# Patient Record
Sex: Female | Born: 1965 | Race: White | Hispanic: No | Marital: Married
Health system: Southern US, Community
[De-identification: ages and names within clinical notes are randomized; demographics above are authoritative.]

---

## 2004-06-11 ENCOUNTER — Ambulatory Visit: Payer: Self-pay | Admitting: Unknown Physician Specialty

## 2005-07-06 ENCOUNTER — Ambulatory Visit: Payer: Self-pay | Admitting: Unknown Physician Specialty

## 2006-06-27 ENCOUNTER — Ambulatory Visit: Payer: Self-pay | Admitting: Unknown Physician Specialty

## 2006-06-27 ENCOUNTER — Other Ambulatory Visit: Payer: Self-pay

## 2006-07-07 ENCOUNTER — Ambulatory Visit: Payer: Self-pay | Admitting: Unknown Physician Specialty

## 2006-10-06 ENCOUNTER — Ambulatory Visit: Payer: Self-pay | Admitting: Unknown Physician Specialty

## 2007-04-14 ENCOUNTER — Ambulatory Visit: Payer: Self-pay | Admitting: Gastroenterology

## 2007-04-18 ENCOUNTER — Ambulatory Visit: Payer: Self-pay | Admitting: Gastroenterology

## 2007-10-13 ENCOUNTER — Ambulatory Visit: Payer: Self-pay | Admitting: Unknown Physician Specialty

## 2008-11-12 IMAGING — CR DG BARIUM ENEMA
1 series · 8 of 9 positions shown · non-contrast
Comparison: none

REASON FOR EXAM: Sigmoid stricture
COMMENTS:

[Series 1: view not recorded · 0.17mm/px · 8 of 9 slices shown]
[im 1/9]
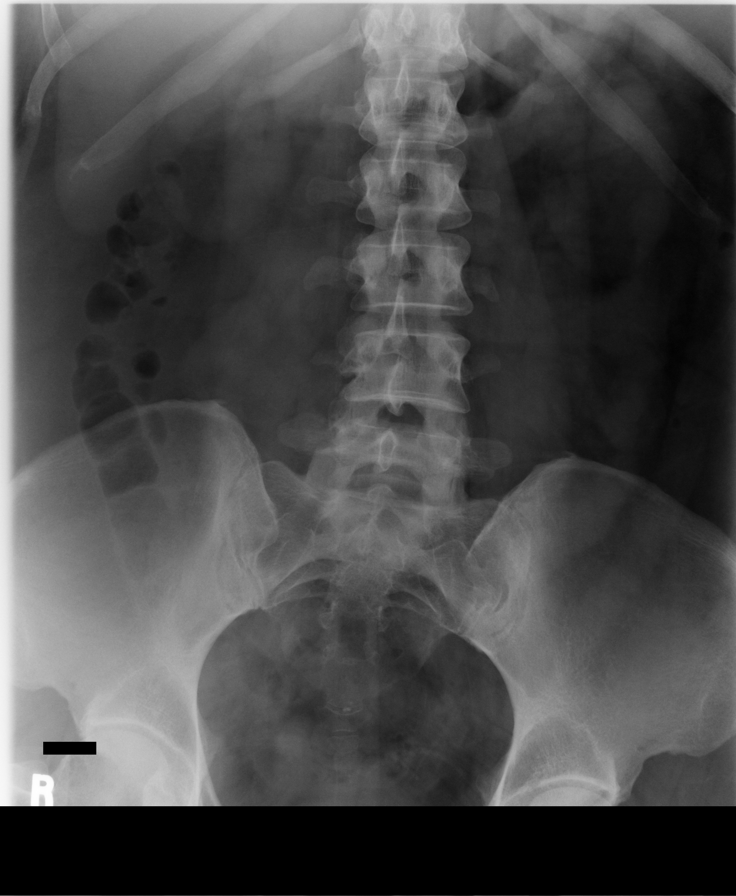
[im 2/9]
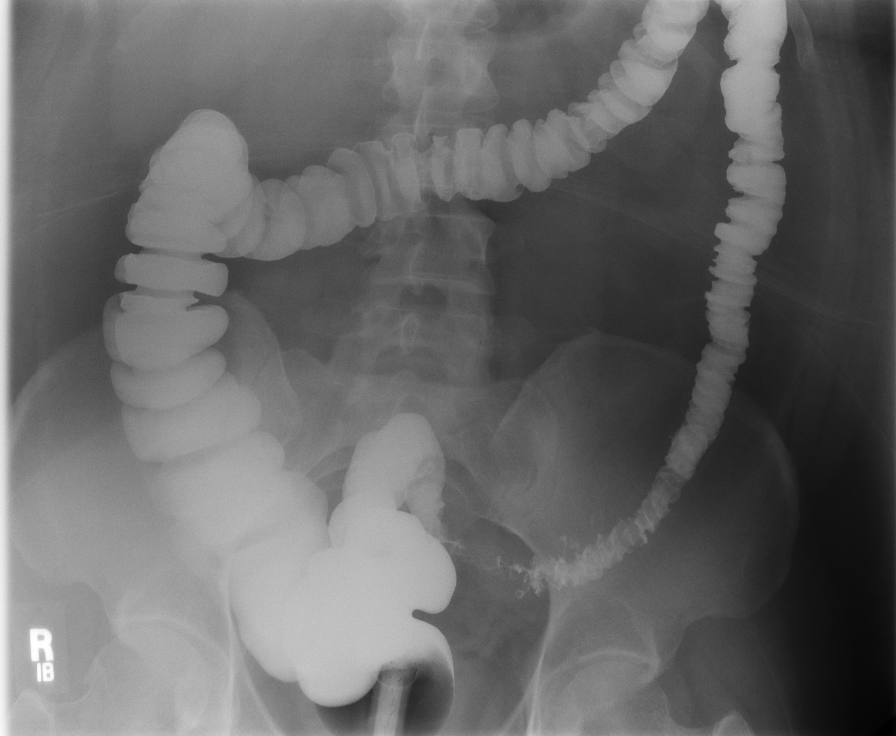
[im 3/9]
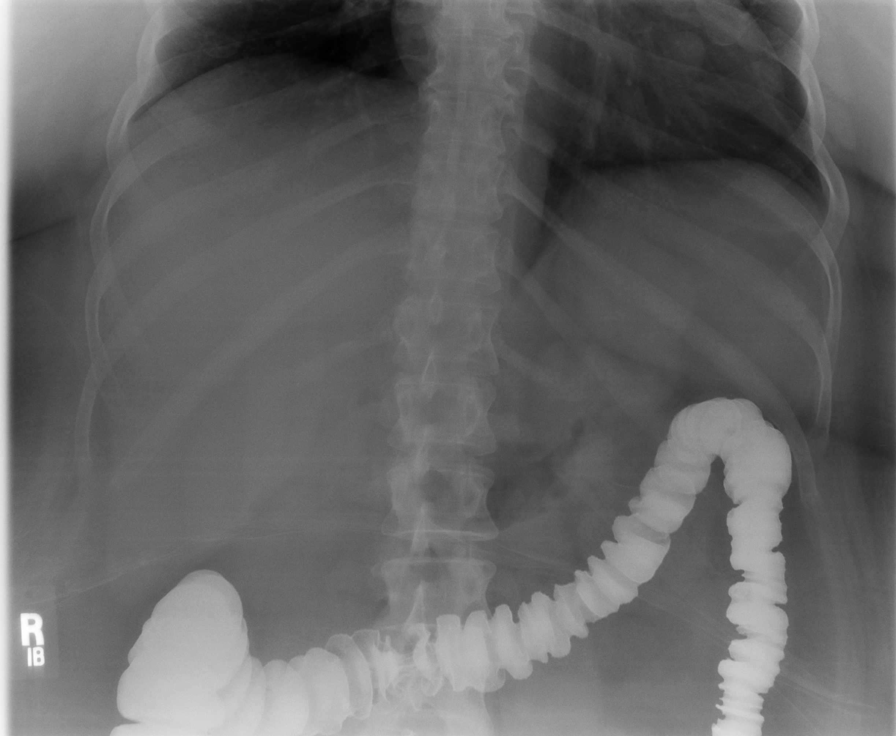
[im 4/9]
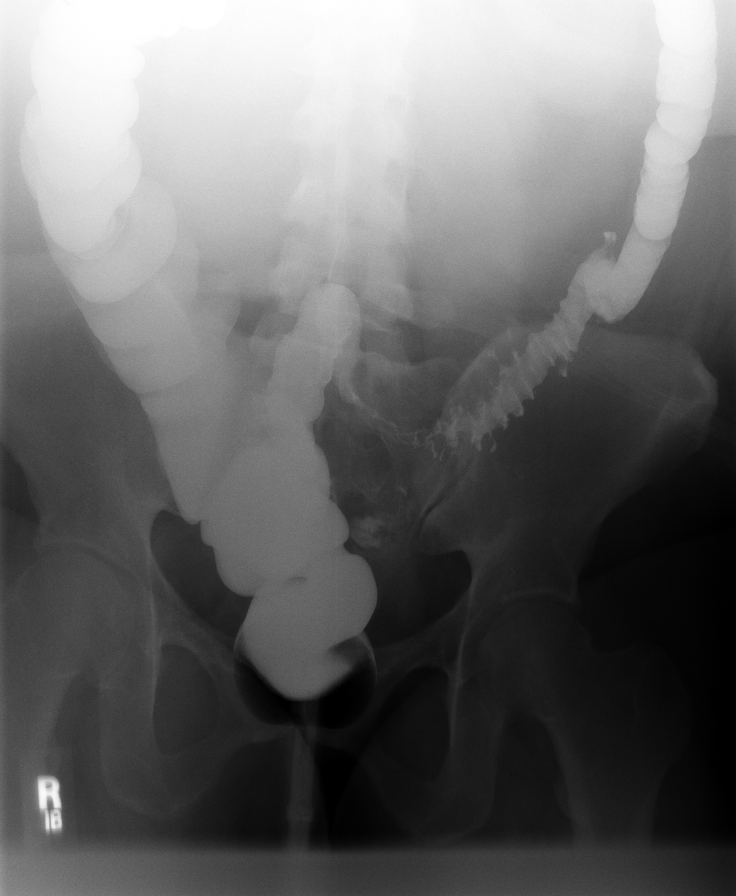
[im 5/9]
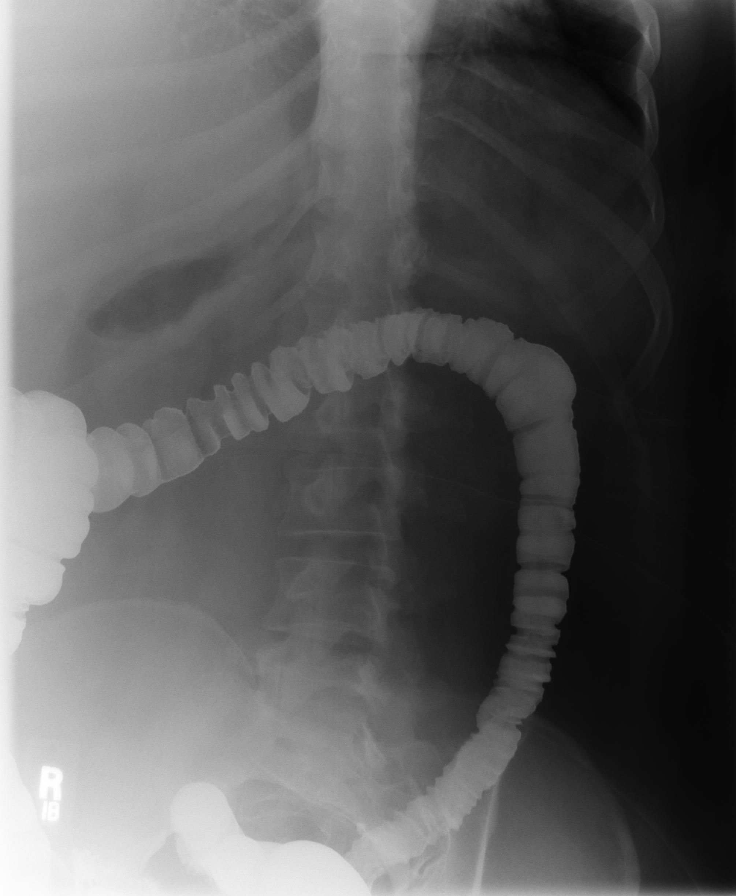
[im 6/9]
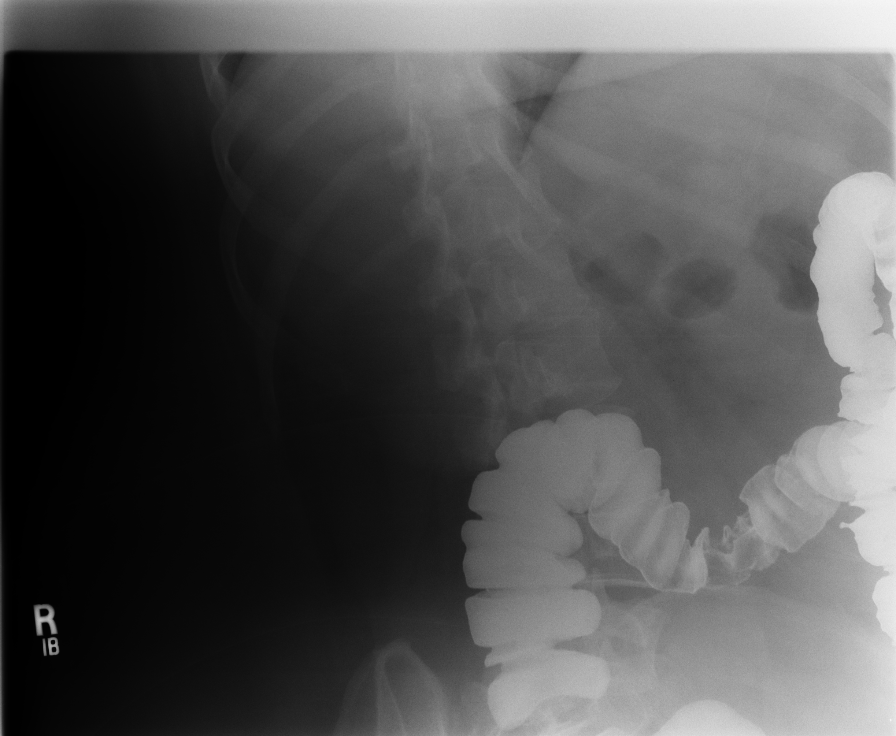
[im 7/9]
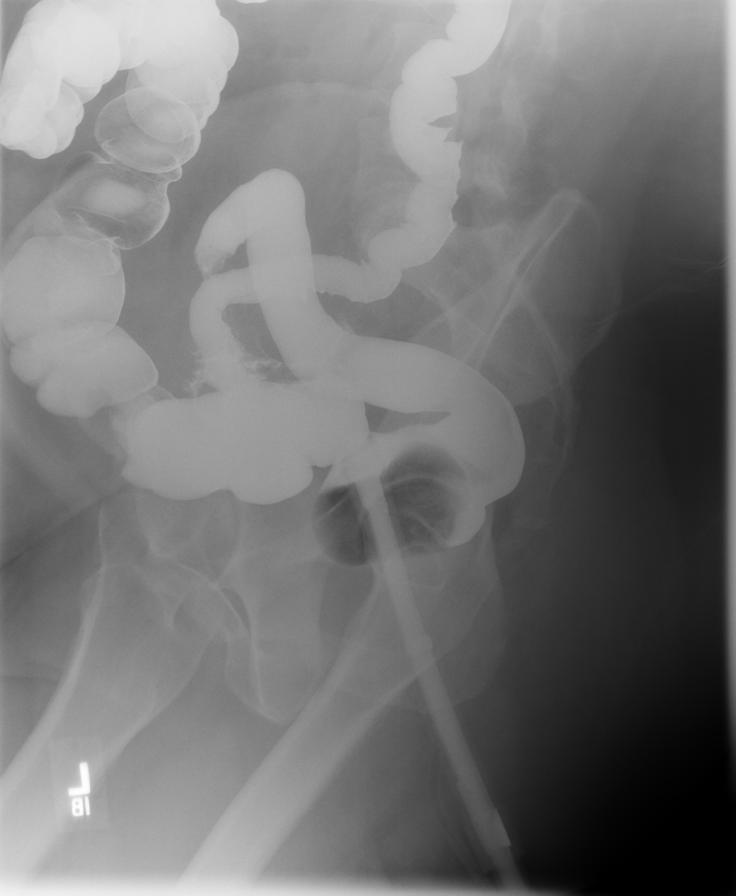
[im 8/9]
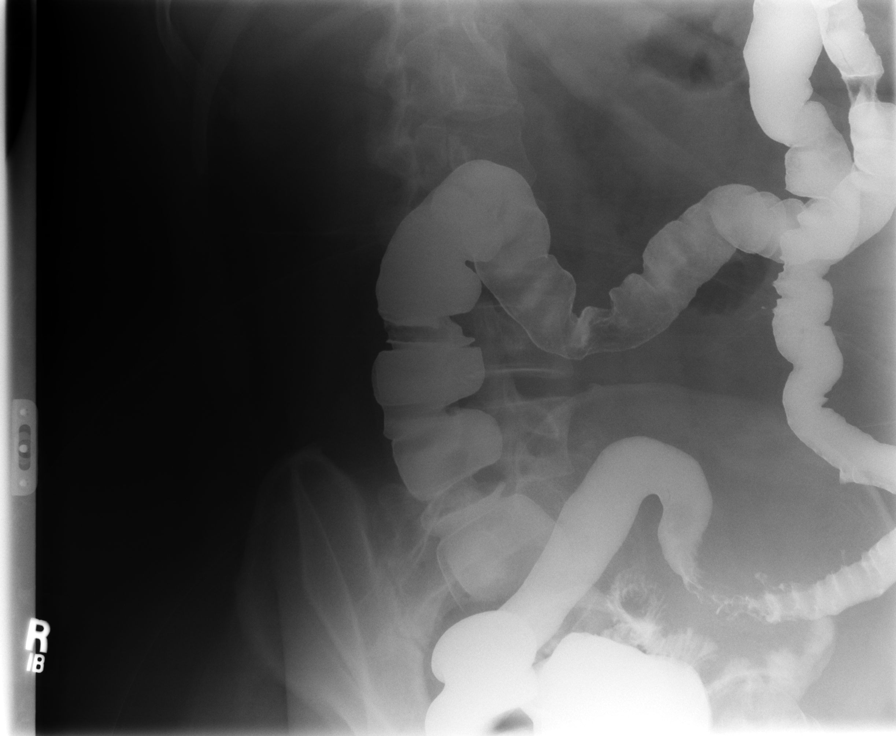

[8 of 9 positions shown; findings below may reference images not displayed]

PROCEDURE:     FL  - FL BARIUM ENEMA (COLON)  - April 18, 2007  [DATE]

RESULT:     The anticipated procedure was discussed with Ms. Pq. Due to
the fact the patient reportedly has a stricture in the sigmoid region, a
single contrast study was performed. It was felt prudent to do this. If
necessary, a follow-up air study can be performed. The patient voiced her
willingness to proceed.

The barium catheter balloon was inflated under fluoroscopic guidance. Barium
was then instilled into the rectum. Barium flowed freely into the rectum and
into the mid and distal sigmoid. The proximal sigmoid; however, revealed an
area of long segment stricturing. This was not completely obstructive and
contrast did pass into the descending colon and more proximally. This area
of stricturing measures approximately 5.0 to 7.0 cm in length. A few
diverticula are noted associated with it.

The appearance of the ascending, transverse and descending colon is normal.
Reflux into the terminal ileum was demonstrated. A few scattered, sigmoid
diverticula were noted in the non-strictured portion. The post evacuation
film did ultimately indicate filling of the appendix as well as a small
portion of the terminal ileum. The area of stricturing remained narrowed.
IMPRESSION: There is a fixed area of narrowing in the proximal sigmoid
colon. This is likely the area that resulted in difficulty in passage of the
colonoscope. More proximally, the bowel appears normal with the exception of
a few, scattered diverticula. It would be reasonable to consider the patient
for CT scanning of the abdomen and pelvis following oral and IV contrast in
an effort to better evaluate the soft tissues surrounding this area of
strictured proximal sigmoid colon.

## 2009-10-24 ENCOUNTER — Ambulatory Visit: Payer: Self-pay | Admitting: Unknown Physician Specialty

## 2011-12-21 ENCOUNTER — Inpatient Hospital Stay: Payer: Self-pay | Admitting: Surgery

## 2011-12-21 LAB — URINALYSIS, COMPLETE
Bilirubin,UR: NEGATIVE
Blood: NEGATIVE
Leukocyte Esterase: NEGATIVE
Nitrite: NEGATIVE
Ph: 6 (ref 4.5–8.0)
Specific Gravity: 1.024 (ref 1.003–1.030)
Squamous Epithelial: 7

## 2011-12-21 LAB — COMPREHENSIVE METABOLIC PANEL
Anion Gap: 12 (ref 7–16)
Calcium, Total: 9 mg/dL (ref 8.5–10.1)
Chloride: 101 mmol/L (ref 98–107)
EGFR (African American): >60
Osmolality: 273 (ref 275–301)
Potassium: 3.7 mmol/L (ref 3.5–5.1)
SGOT(AST): 30 U/L (ref 15–37)
Sodium: 136 mmol/L (ref 136–145)
Total Protein: 7.2 g/dL (ref 6.4–8.2)

## 2011-12-21 LAB — CBC
Platelet: 193 10*3/uL (ref 150–440)
RBC: 4.92 10*6/uL (ref 3.80–5.20)
WBC: 11.3 10*3/uL — ABNORMAL HIGH (ref 3.6–11.0)

## 2011-12-27 LAB — PATHOLOGY REPORT

## 2014-07-23 NOTE — H&P (Signed)
PATIENT NAME:  [name], [name] DATE OF BIRTH:  1965-11-19  DATE OF ADMISSION:  12/21/2011  ADMITTING DIAGNOSES:  1. Acute calculus cholecystitis.  2. Hypertension.  3. Obesity.   HISTORY: This is a 49 year old white female with approximately one-year history of intermittent abdominal pain in the epigastric and right upper quadrant usually occurring in the middle of the night and responding to rest, antacids and/or vomiting. She has had pretty consistent symptoms for the last year. At midnight, the patient had a similar type of pain but much more severe and this has been unrelenting all day. She has had nausea and vomiting. No fever, no jaundice, no sick contacts. Work-up in the Emergency Room is suggestive of acute calculus cholecystitis with an ultrasound which demonstrates a 7 mm stone lodged within the gallbladder neck. Common bile duct is 3.3 mm. Lipase and liver function tests are normal. Surgical services were consulted after the patient failed to improve with narcotics.   ALLERGIES: Tylenol causes which causes anaphylaxis.   MEDICATIONS AT HOME:  1. Hydrochlorothiazide 12.5 mg by mouth once a day. 2. Toprol-XL 100 mg by mouth extended-release 1 tab by mouth once a day. 3. Vitamin D 1000 international units oral capsule 1 tab by mouth a day.   PAST MEDICAL HISTORY:  1. Obesity. 2. Hypertension. 3. Cholelithiasis.   PAST SURGICAL HISTORY:  1. Hysterectomy. 2. Orthopedic procedures for broken bones.   FAMILY HISTORY: Noncontributory.   SOCIAL HISTORY: The patient smokes approximately a pack every 1 to 2 days. Social drinking. Married. Employed.   REVIEW OF SYSTEMS: Significant for the above findings including that of nausea and vomiting but no jaundice. No fevers. Significant abdominal pain. No dysuria. No shortness of breath. No chest pain. Remaining ten-point review is negative.   PHYSICAL EXAMINATION:  VITAL SIGNS: Temperature 98.1, pulse 60, blood pressure  172/102.   GENERAL: The patient is in no obvious distress. Her husband is at bedside. She is obese, alert and oriented.   HEENT: Extraocular muscles are intact.   NECK: Supple. No adenopathy.   HEART: Regular rate and rhythm.  LUNGS: Normal respiratory effort. Clear breath sounds bilaterally.   ABDOMEN: Soft but tender in the right upper quadrant with a positive Murphy sign. No obvious hernias. Well healed Pfannenstiel incision.   MUSCULOSKELETAL: Normal.   PSYCHIATRIC: Appropriate mood, judgment, and affect.   NEUROLOGICAL: Grossly normal with intact cranial nerves. Normal facies.   LABORATORY, DIAGNOSTIC AND RADIOLOGICAL DATA: Total bilirubin 0.5, alkaline phosphatase 111, ALT 50, AST 30. Total protein 7.2, albumin 3.4, sodium 137, potassium 3.7, chloride 101, CO2 23, lipase 106. Urinalysis negative. White count 11.3, hemoglobin 13.6, hematocrit 40.6, platelet count 193,000. Independent review of ultrasound demonstrates cholelithiasis and nonmobile stones and the gallbladder neck. Common bile duct measuring 3 mm.   IMPRESSION: 49 year old white female with acute calculus cholecystitis and a previous history of one year of biliary colic. She has a history of hypertension and obesity, previous hysterectomy.   PLAN: The patient will be admitted, hydrated, pain and nausea control tonight with plan for laparoscopic cholecystectomy Wednesday as an add-on. I discussed with her the risks, benefits, and alternatives to surgery including that of bleeding, infection, bile duct injury, need for conversion to open operation as well as future need for ERCP. All of her questions are answered.   ____________________________ Redge GainerMark A. Egbert GaribaldiBird, MD mab:cms D: 12/21/2011 19:27:43 ET T: 12/22/2011 06:12:25 ET JOB#: [id number]  cc: Loraine LericheMark A. Egbert GaribaldiBird, MD, <Dictator>  Panagiota Perfetti A Oradell Antolin  MD ELECTRONICALLY SIGNED 12/23/2011 18:34

## 2014-07-23 NOTE — H&P (Signed)
Subjective/Chief Complaint severe RUQ pain , nausea and emesis    History of Present Illness 49 yof with about a year history of intermittent nocturnal epigastric and RUQ pain followed by nausea and emesis.  Pain usually goes away with antacids and rest.  No particular relation to food intake.  Last night pain started at MN and has pregressed all day without relief.  Seen in Er and US shows extensive stones and stone lodged in Gb neck.  Some relief with narcotics,  Surgery asked to evaluate.    Past History hypertension    Past Medical Health Hypertension   Past Med/Surgical Hx:  hypertension:   hysterectomy:   hardware removal rt ankle:   orif right ankle:   ALLERGIES:  Tylenol: Anaphylaxis    Other Allergies none   Family and Social History:   Family History Non-Contributory    Social History positive  tobacco, positive  tobacco (Current within 1 year), positive ETOH, social    + Tobacco Current (within 1 year)    Place of Living Home  married, employed.   Review of Systems:   Subjective/Chief Complaint see above, no jaundice, no fevers.    Abdominal Pain Yes    Nausea/Vomiting Yes    Tolerating Diet No  Nauseated  Vomiting    Medications/Allergies Reviewed Medications/Allergies reviewed   Physical Exam:   GEN no acute distress, obese, temp 98.1 p 60 172/102    HEENT pale conjunctivae, PERRL    NECK supple    RESP normal resp effort  clear BS    CARD regular rate    ABD positive tenderness  denies Flank Tenderness  no liver/spleen enlargement  soft  normal BS  positive murphy's sign.    LYMPH negative neck    EXTR negative cyanosis/clubbing    SKIN normal to palpation    NEURO cranial nerves intact    PSYCH A+O to time, place, person   Lab Results: Hepatic:  17-Sep-13 09:26    Bilirubin, Total 0.5   Alkaline Phosphatase 111   SGPT (ALT) 50   SGOT (AST) 30   Total Protein, Serum 7.2   Albumin, Serum 3.4  Routine Chem:  17-Sep-13 09:26     Glucose, Serum  137   BUN 11   Creatinine (comp) 0.77   Sodium, Serum 136   Potassium, Serum 3.7   Chloride, Serum 101   CO2, Serum 23   Calcium (Total), Serum 9.0   Osmolality (calc) 273   eGFR (African American) >60   eGFR (Non-African American) >60 (eGFR values <46m/min/1.73 m2 may be an indication of chronic kidney disease (CKD). Calculated eGFR is useful in patients with stable renal function. The eGFR calculation will not be reliable in acutely ill patients when serum creatinine is changing rapidly. It is not useful in  patients on dialysis. The eGFR calculation may not be applicable to patients at the low and high extremes of body sizes, pregnant women, and vegetarians.)   Anion Gap 12   Lipase 106 (Result(s) reported on 21 Dec 2011 at 10:07AM.)  Routine UA:  17-Sep-13 09:26    Color (UA) Yellow   Clarity (UA) Cloudy   Glucose (UA) Negative   Bilirubin (UA) Negative   Ketones (UA) Trace   Specific Gravity (UA) 1.024   Blood (UA) Negative   pH (UA) 6.0   Protein (UA) 30 mg/dL   Nitrite (UA) Negative   Leukocyte Esterase (UA) Negative (Result(s) reported on 21 Dec 2011 at 10:52AM.)  RBC (UA) 7 /HPF   WBC (UA) 1 /HPF   Bacteria (UA) TRACE   Epithelial Cells (UA) 7 /HPF   Mucous (UA) PRESENT (Result(s) reported on 21 Dec 2011 at 10:52AM.)  Routine Hem:  17-Sep-13 09:26    WBC (CBC)  11.3   RBC (CBC) 4.92   Hemoglobin (CBC) 13.6   Hematocrit (CBC) 40.6   Platelet Count (CBC) 193 (Result(s) reported on 21 Dec 2011 at 01:17PM.)   MCV 83   MCH 27.6   MCHC 33.4   RDW  15.9   Radiology Results: [place]:    17-Sep-13 11:13, US Abdomen Limited Survey   US Abdomen Limited Survey   REASON FOR EXAM:    pain - abd - focal pain RUQ on exam  COMMENTS:   Body Site: GB and Fossa, CBD, Head of Pancreas    PROCEDURE: [place]  - US ABDOMEN LIMITED SURVEY  - Dec 21 2011 11:13AM     RESULT: Comparison: None.    Technique: Multiple grayscale and color Doppler images were obtained  of   the right upper quadrant.    Findings:  The liver is relatively hyperechoic and dense, suggesting hepatic   steatosis. The tail of the pancreas was obscured by overlying bowel gas.   The visualized portion of the pancreatic head and body is unremarkable.   The common bile duct measures 3 mm in diameter. The portal vein is     patent. The gallbladder wall is at the upper limits of normal in   thickness, measuring 3 mm in thickness. The sonographic Percell Miller sign was   negative. There multiple stones within the gallbladder. There is a 7 mm   stone which is nonmobile in the gallbladder neck.    IMPRESSION:   1. Cholelithiasis. There is a nonmobile stones in the gallbladder neck.   The bladder wall thickness is at the upper limits normal however the   sonographic Murphy sign was negative. Correlate clinically for   cholecystitis.  2. Findings raising the possibility of hepatic steatosis.    Dictation site: 2        Verified By: Gregor Hams, M.D., MD     Assessment/Admission Diagnosis 49 year old female with acute calculus cholecystitis and previous history of biliary colic. hypertension obesity    Plan Admit, hydrate, pain and nausea control. laparoscopic cholecystectomy wednesday, discussed r/ba with patient and husband all questions addressed.   Electronic Signatures: Sherri Rad (MD)  (Signed 17-Sep-13 19:20)  Authored: CHIEF COMPLAINT and HISTORY, PAST MEDICAL/SURGIAL HISTORY, ALLERGIES, Other Allergies, FAMILY AND SOCIAL HISTORY, REVIEW OF SYSTEMS, PHYSICAL EXAM, LABS, Radiology, ASSESSMENT AND PLAN   Last Updated: 17-Sep-13 19:20 by Sherri Rad (MD)

## 2014-07-23 NOTE — Op Note (Signed)
PATIENT NAME:  [name], [name] MR#:  [id number] DATE OF BIRTH:  [birth year]  DATE OF PROCEDURE:  12/22/2011  PREOPERATIVE DIAGNOSIS: Acute calculus cholecystitis.   POSTOPERATIVE DIAGNOSIS: Acute calculus cholecystitis.   PROCEDURE PERFORMED: Laparoscopic cholecystectomy.   SURGEON: Natale LayMark Poetry Cerro, M.D.   ASSISTANT: CST times 2.  TYPE OF ANESTHESIA: General endotracheal   INDICATION: This is a 49 year old female with acute calculus cholecystitis and a previous history dating one year of intermittent biliary colic type symptoms. Ultrasonography demonstrates stones, one lodged in the gallbladder neck.   FINDINGS: As above.   SPECIMENS: Gallbladder with contents.   ESTIMATED BLOOD LOSS: 75 mL.   DRAINS: Jackson-Pratt x1 in gallbladder fossa.   DESCRIPTION OF PROCEDURE: Informed consent, supine position, surgical time out, sterile prep and drape.   A 12 mm blunt Hassan trocar was placed in open technique through an infraumbilical transversely oriented skin incision.   Pneumoperitoneum was established. Remaining trocars were placed in standard location, 5 mm.   The gallbladder was tensely distended and aspirated of approximately 60 mL of thin bile. The gallbladder was then grasped along its fundus and elevated towards the right shoulder. There was an acute inflammatory rind around the hepatoduodenal ligament which was dissected free with a combination of blunt technique, hydrodissection with irrigation device, and the microdissector. Cystic duct was identified. The cystic duct/gallbladder junction was identified. The cystic duct was then triply clipped on the portal side, singly clipped on the gallbladder side, and divided. Cystic artery was immediately identified adjacent, doubly clipped on the portal side, singly clipped on the gallbladder side, and divided. Further dissection in this area demonstrated no evidence of aberrant bile duct or artery.   The gallbladder was then carefully taken off  the gallbladder fossa utilizing hook cautery apparatus. It was placed into an Endo Catch device and retrieved. In order to obtain retrievement, due to the size of the stones and the thickness of the abdominal wall, the infraumbilical fascial defect was incised an additional centimeter with Mayo scissors. The specimen was easily retrieved at this point.   The right upper quadrant was then irrigated with a total of 2 liters of normal saline during the operation, aspirated dry, and point hemostasis was obtained with electrocautery. 10 mL of Surgicel with thrombin was then applied. A 19 mm Blake drain was directed into the space and exited the right upper quadrant lowermost incision. Drain site was secured with 4-0 nylon and the drain was directed into Morrison's pouch.   With hemostasis being ensured on the operative field and no evidence of bile leakage or bowel injury, the trocars were removed. The infraumbilical fascial defect was closed with several interrupted and figure-of-eight vertically oriented #0 Vicryl sutures. The existing stay sutures were tied to each other. A total of 30 mL of 0.25% plain Marcaine was infiltrated along all skin and fascial incisions prior to closure. 4-0 Vicryl subcuticular was applied to all skin edges followed by benzoin, Steri-Strips, Telfa, and Tegaderm. The patient was then subsequently extubated and taken to the recovery room in stable and satisfactory condition by anesthesia services.   ____________________________ Redge GainerMark A. Egbert GaribaldiBird, MD FACS mab:slb D: 12/23/2011 14:35:00 ET     T: 12/23/2011 17:34:02 ET        JOB#: [id number] cc: Loraine LericheMark A. Egbert GaribaldiBird, MD, <Dictator> Raynald KempMARK A Dolora Ridgely MD ELECTRONICALLY SIGNED 12/23/2011 18:37

## 2014-07-23 NOTE — Discharge Summary (Signed)
PATIENT NAME:  [name], [name] MR#:  [id number] DATE OF BIRTH:  04-25-1965  DATE OF ADMISSION:  12/21/2011 DATE OF DISCHARGE:  12/23/2011  FINAL DIAGNOSIS: Acute calculus cholecystitis.   PRINCIPAL PROCEDURES:  Laparoscopic cholecystectomy.   HOSPITAL COURSE: The patient was admitted with significant abdominal pain with the diagnosis of acute calculus cholecystitis. She was placed on intravenous antibiotics, bowel rest antiemetics and anti-nausea medicines. The patient was then subsequently taken to the Operating Room on September the18th at which point laparoscopic cholecystectomy was able to be performed. She tolerated this procedure well. Due to the late nature of the day, the patient was then kept overnight and discharged home in stable condition with Jackson-Pratt drain in place on 12/23/2010 and I will follow her up in the office next week.   DISCHARGE MEDICATIONS:  1. Hydrochlorothiazide 12.5 mg one cap by mouth daily 2. Toprol-XL 100 mg by mouth extended-release 1 tab p.o. daily  3. Vitamin D3. 1 tab p.o. daily 4. Tramadol 50 mg by mouth every six hours as needed for pain   DISCHARGE INSTRUCTIONS: Call with any questions or concerns    ____________________________ Redge GainerMark A. Egbert GaribaldiBird, MD mab:tbf D: 12/26/2011 16:40:30 ET T: 12/27/2011 15:08:05 ET JOB#: [id number]  cc: Loraine LericheMark A. Egbert GaribaldiBird, MD, <Dictator> Marshal Schrecengost A Charelle Petrakis MD ELECTRONICALLY SIGNED 12/29/2011 9:25
